# Patient Record
Sex: Male | Born: 1980 | Race: Black or African American | Hispanic: No | Marital: Single | Smoking: Current every day smoker
Health system: Southern US, Community
[De-identification: ages and names within clinical notes are randomized; demographics above are authoritative.]

---

## 1999-11-11 ENCOUNTER — Emergency Department (HOSPITAL_COMMUNITY): Admission: EM | Admit: 1999-11-11 | Discharge: 1999-11-11 | Payer: Self-pay | Admitting: Emergency Medicine

## 1999-11-11 ENCOUNTER — Encounter: Payer: Self-pay | Admitting: Emergency Medicine

## 2016-10-14 ENCOUNTER — Emergency Department (HOSPITAL_COMMUNITY)
Admission: EM | Admit: 2016-10-14 | Discharge: 2016-10-15 | Disposition: A | Discharge status: Home / Self Care | Payer: Self-pay | Attending: Emergency Medicine | Admitting: Emergency Medicine

## 2016-10-14 DIAGNOSIS — S71132A Puncture wound without foreign body, left thigh, initial encounter: Secondary | ICD-10-CM

## 2016-10-14 DIAGNOSIS — Y929 Unspecified place or not applicable: Secondary | ICD-10-CM | POA: Insufficient documentation

## 2016-10-14 DIAGNOSIS — R7989 Other specified abnormal findings of blood chemistry: Secondary | ICD-10-CM

## 2016-10-14 DIAGNOSIS — Z23 Encounter for immunization: Secondary | ICD-10-CM | POA: Insufficient documentation

## 2016-10-14 DIAGNOSIS — Y999 Unspecified external cause status: Secondary | ICD-10-CM | POA: Insufficient documentation

## 2016-10-14 DIAGNOSIS — R791 Abnormal coagulation profile: Secondary | ICD-10-CM | POA: Insufficient documentation

## 2016-10-14 DIAGNOSIS — S71102A Unspecified open wound, left thigh, initial encounter: Secondary | ICD-10-CM | POA: Insufficient documentation

## 2016-10-14 DIAGNOSIS — W3400XA Accidental discharge from unspecified firearms or gun, initial encounter: Secondary | ICD-10-CM | POA: Insufficient documentation

## 2016-10-14 DIAGNOSIS — R740 Nonspecific elevation of levels of transaminase and lactic acid dehydrogenase [LDH]: Secondary | ICD-10-CM | POA: Insufficient documentation

## 2016-10-14 DIAGNOSIS — Y9301 Activity, walking, marching and hiking: Secondary | ICD-10-CM | POA: Insufficient documentation

## 2016-10-14 MED ORDER — TETANUS-DIPHTH-ACELL PERTUSSIS 5-2.5-18.5 LF-MCG/0.5 IM SUSP
0.5000 mL | Freq: Once | INTRAMUSCULAR | Status: AC
  Administered 2016-10-15: 0.5 mL via INTRAMUSCULAR
  Filled 2016-10-14: qty 0.5
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
## 2016-10-15 ENCOUNTER — Emergency Department (HOSPITAL_COMMUNITY): Payer: Self-pay

## 2016-10-15 ENCOUNTER — Other Ambulatory Visit (HOSPITAL_COMMUNITY): Payer: Self-pay

## 2016-10-15 ENCOUNTER — Encounter (HOSPITAL_COMMUNITY): Payer: Self-pay | Admitting: Emergency Medicine

## 2016-10-15 LAB — CBC
HCT: 40.7 % (ref 39.0–52.0)
HEMOGLOBIN: 13.9 g/dL (ref 13.0–17.0)
MCH: 32 pg (ref 26.0–34.0)
MCHC: 34.2 g/dL (ref 30.0–36.0)
MCV: 93.8 fL (ref 78.0–100.0)
PLATELETS: 171 10*3/uL (ref 150–400)
RBC: 4.34 MIL/uL (ref 4.22–5.81)
RDW: 13.9 % (ref 11.5–15.5)
WBC: 8.1 10*3/uL (ref 4.0–10.5)

## 2016-10-15 LAB — I-STAT CHEM 8, ED
BUN: 16 mg/dL (ref 6–20)
CALCIUM ION: 1.03 mmol/L — AB (ref 1.15–1.40)
CREATININE: 1.2 mg/dL (ref 0.61–1.24)
Chloride: 107 mmol/L (ref 101–111)
GLUCOSE: 136 mg/dL — AB (ref 65–99)
HCT: 44 % (ref 39.0–52.0)
HEMOGLOBIN: 15 g/dL (ref 13.0–17.0)
POTASSIUM: 3.5 mmol/L (ref 3.5–5.1)
Sodium: 140 mmol/L (ref 135–145)
TCO2: 20 mmol/L (ref 0–100)

## 2016-10-15 LAB — PROTIME-INR
INR: 0.95
PROTHROMBIN TIME: 12.6 s (ref 11.4–15.2)

## 2016-10-15 LAB — TYPE AND SCREEN
ABO/RH(D): O POS
Antibody Screen: NEGATIVE
UNIT DIVISION: 0
UNIT DIVISION: 0

## 2016-10-15 LAB — COMPREHENSIVE METABOLIC PANEL
ALK PHOS: 40 U/L (ref 38–126)
ALT: 19 U/L (ref 17–63)
ANION GAP: 12 (ref 5–15)
AST: 30 U/L (ref 15–41)
Albumin: 3.9 g/dL (ref 3.5–5.0)
BUN: 13 mg/dL (ref 6–20)
CALCIUM: 9 mg/dL (ref 8.9–10.3)
CHLORIDE: 108 mmol/L (ref 101–111)
CO2: 20 mmol/L — ABNORMAL LOW (ref 22–32)
CREATININE: 1.28 mg/dL — AB (ref 0.61–1.24)
Glucose, Bld: 139 mg/dL — ABNORMAL HIGH (ref 65–99)
Potassium: 3.6 mmol/L (ref 3.5–5.1)
SODIUM: 140 mmol/L (ref 135–145)
Total Bilirubin: 0.4 mg/dL (ref 0.3–1.2)
Total Protein: 6.2 g/dL — ABNORMAL LOW (ref 6.5–8.1)

## 2016-10-15 LAB — CDS SEROLOGY

## 2016-10-15 LAB — PREPARE FRESH FROZEN PLASMA
UNIT DIVISION: 0
Unit division: 0

## 2016-10-15 LAB — I-STAT CG4 LACTIC ACID, ED: Lactic Acid, Venous: 5.73 mmol/L (ref 0.5–1.9)

## 2016-10-15 LAB — ABO/RH: ABO/RH(D): O POS

## 2016-10-15 LAB — ETHANOL

## 2016-10-15 MED ORDER — KETOROLAC TROMETHAMINE 30 MG/ML IJ SOLN: 30.0000 mg | Freq: Once | INTRAMUSCULAR | Status: DC

## 2016-10-15 MED ORDER — TRAMADOL HCL 50 MG PO TABS: 50.0000 mg | ORAL_TABLET | Freq: Four times a day (QID) | ORAL | 0 refills | Status: DC | PRN

## 2016-10-15 MED ORDER — KETOROLAC TROMETHAMINE 15 MG/ML IJ SOLN
INTRAMUSCULAR | Status: AC
  Administered 2016-10-15: 30 mg
  Filled 2016-10-15: qty 2
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
## 2016-10-15 NOTE — Discharge Instructions (Signed)
Take ibuprofen or acetaminophen as needed for less severe pain. °
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
## 2018-07-11 ENCOUNTER — Other Ambulatory Visit: Payer: Self-pay

## 2018-07-11 ENCOUNTER — Emergency Department (HOSPITAL_COMMUNITY): Payer: Self-pay

## 2018-07-11 ENCOUNTER — Emergency Department (HOSPITAL_BASED_OUTPATIENT_CLINIC_OR_DEPARTMENT_OTHER): Payer: Self-pay

## 2018-07-11 ENCOUNTER — Emergency Department (HOSPITAL_COMMUNITY)
Admission: EM | Admit: 2018-07-11 | Discharge: 2018-07-11 | Disposition: A | Discharge status: Home / Self Care | Payer: Self-pay | Attending: Emergency Medicine | Admitting: Emergency Medicine

## 2018-07-11 ENCOUNTER — Encounter (HOSPITAL_COMMUNITY): Payer: Self-pay | Admitting: *Deleted

## 2018-07-11 DIAGNOSIS — R600 Localized edema: Secondary | ICD-10-CM | POA: Insufficient documentation

## 2018-07-11 DIAGNOSIS — R609 Edema, unspecified: Secondary | ICD-10-CM

## 2018-07-11 DIAGNOSIS — F17210 Nicotine dependence, cigarettes, uncomplicated: Secondary | ICD-10-CM | POA: Insufficient documentation

## 2018-07-11 LAB — COMPREHENSIVE METABOLIC PANEL
ALT: 24 U/L (ref 0–44)
AST: 19 U/L (ref 15–41)
Albumin: 3.9 g/dL (ref 3.5–5.0)
Alkaline Phosphatase: 55 U/L (ref 38–126)
Anion gap: 9 (ref 5–15)
BUN: 17 mg/dL (ref 6–20)
CO2: 23 mmol/L (ref 22–32)
Calcium: 8.9 mg/dL (ref 8.9–10.3)
Chloride: 106 mmol/L (ref 98–111)
Creatinine, Ser: 1.15 mg/dL (ref 0.61–1.24)
GFR calc Af Amer: >60 mL/min (ref >60)
GFR calc non Af Amer: >60 mL/min (ref >60)
Glucose, Bld: 94 mg/dL (ref 70–99)
Potassium: 4.1 mmol/L (ref 3.5–5.1)
Sodium: 138 mmol/L (ref 135–145)
Total Bilirubin: 0.5 mg/dL (ref 0.3–1.2)
Total Protein: 6.4 g/dL — ABNORMAL LOW (ref 6.5–8.1)

## 2018-07-11 LAB — CBC
HCT: 41.9 % (ref 39.0–52.0)
Hemoglobin: 13.7 g/dL (ref 13.0–17.0)
MCH: 32 pg (ref 26.0–34.0)
MCHC: 32.7 g/dL (ref 30.0–36.0)
MCV: 97.9 fL (ref 78.0–100.0)
PLATELETS: 171 10*3/uL (ref 150–400)
RBC: 4.28 MIL/uL (ref 4.22–5.81)
RDW: 13.7 % (ref 11.5–15.5)
WBC: 7.5 10*3/uL (ref 4.0–10.5)

## 2018-07-11 LAB — PROTIME-INR
INR: 0.96
PROTHROMBIN TIME: 12.7 s (ref 11.4–15.2)

## 2018-07-11 MED ORDER — FUROSEMIDE 20 MG PO TABS: 20.0000 mg | ORAL_TABLET | Freq: Every day | ORAL | 1 refills | Status: AC

## 2018-07-11 MED ORDER — IOHEXOL 300 MG/ML  SOLN
100.0000 mL | Freq: Once | INTRAMUSCULAR | Status: AC | PRN
  Administered 2018-07-11: 100 mL via INTRAVENOUS

## 2018-07-11 NOTE — ED Triage Notes (Signed)
The pt has a pedal pulse lt foot

## 2018-07-11 NOTE — ED Notes (Signed)
Pt verbalizes understanding of d/c instructions. Pt received prescriptions. Pt ambulatory at d/c with all belongings.  
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
## 2018-07-11 NOTE — ED Notes (Signed)
Patient transported to CT 

## 2018-07-11 NOTE — Progress Notes (Signed)
VASCULAR LAB PRELIMINARY  PRELIMINARY  PRELIMINARY  PRELIMINARY  Left lower extremity venous duplex completed.    Preliminary report:  There is no DVT or SVT noted in the left lower extremity.  There is an enlarged inguinal lymph node that appears vascularized.  Interstitial fluid noted throughout calf.  [facility] to [facility], PA-C  Arsal Tappan, [facility], RVT 07/11/2018, 6:40 PM

## 2018-07-11 NOTE — ED Notes (Signed)
Family at bedside. 

## 2018-11-05 IMAGING — CT CT VENOGRAM ABD-PELV
2 of 4 series · 16 of 46 positions shown, 18 images · IV contrast (omnipaque)
Comparison: None.

CLINICAL DATA: Severe left lower extremity swelling. Suspected
central venous compromise or mass.

EXAM:
CT ABDOMEN AND PELVIS WITH CONTRAST
TECHNIQUE: Multidetector CT imaging of the abdomen and pelvis was performed
using the venogram protocol, following administration of intravenous
contrast.
CONTRAST:  100mL OMNIPAQUE IOHEXOL 300 MG/ML  SOLN

[Series 3: abd/ pelvis 5.0 i30f 2 · axial · 0.98mm/px · z∈[+892,+1352]mm · 13 of 102 slices shown, 15 images]
[im 5/102  soft-tissue]
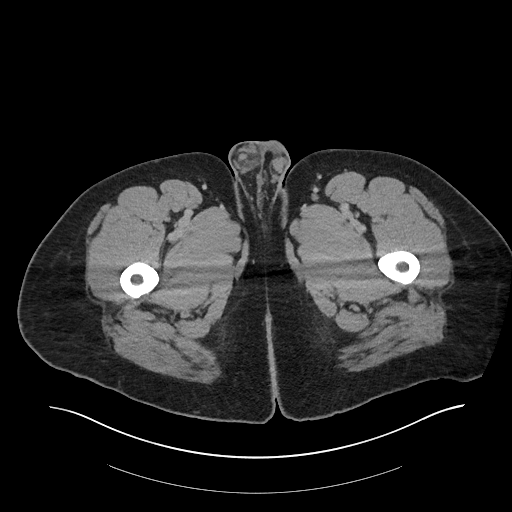
[im 5/102  bone]
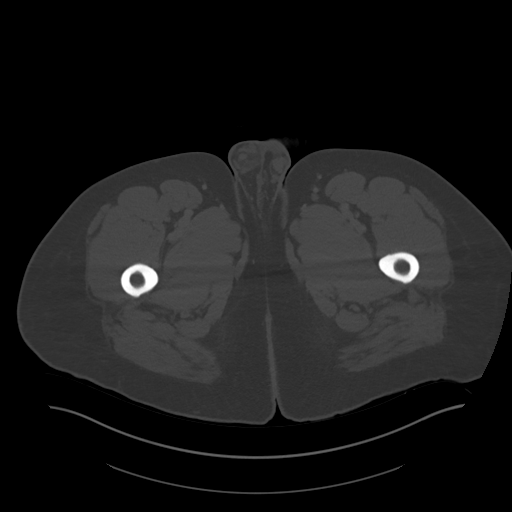
[im 13/102  soft-tissue]
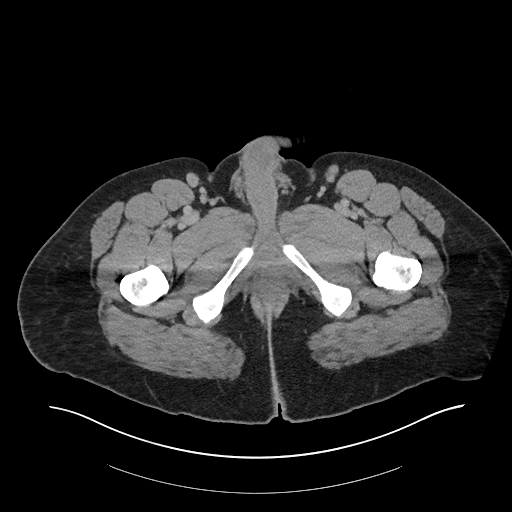
[im 22/102  soft-tissue]
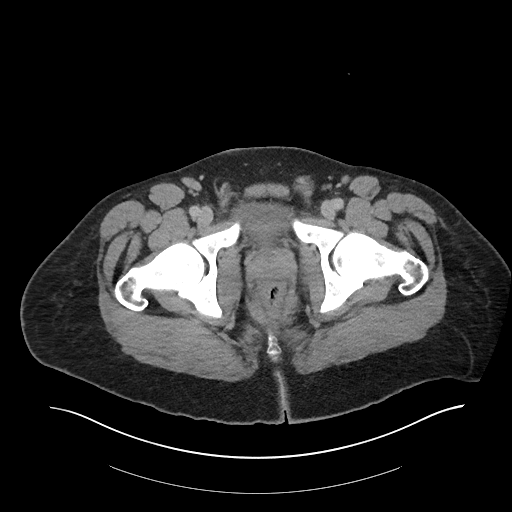
[im 30/102  soft-tissue]
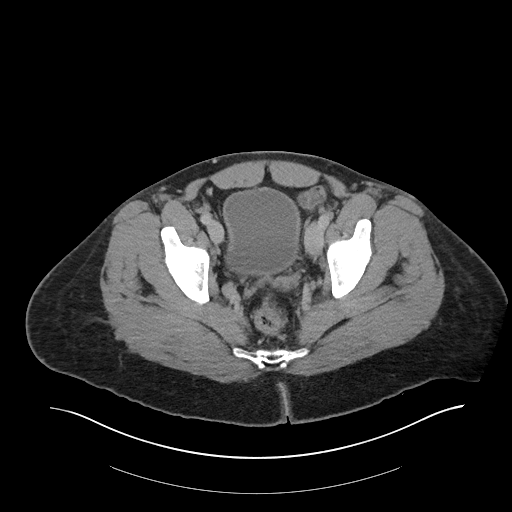
[im 34/102  soft-tissue]
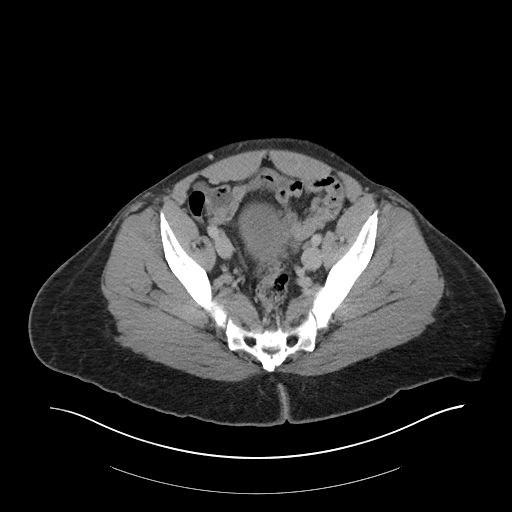
[im 43/102  soft-tissue]
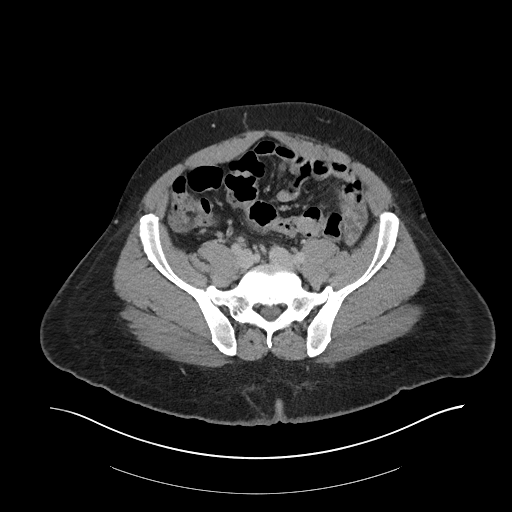
[im 51/102  soft-tissue]
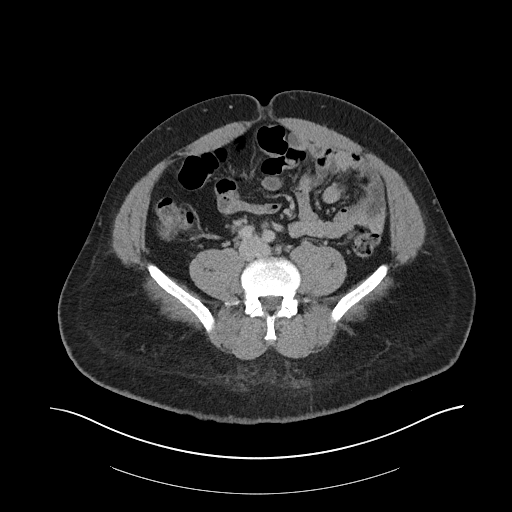
[im 59/102  soft-tissue]
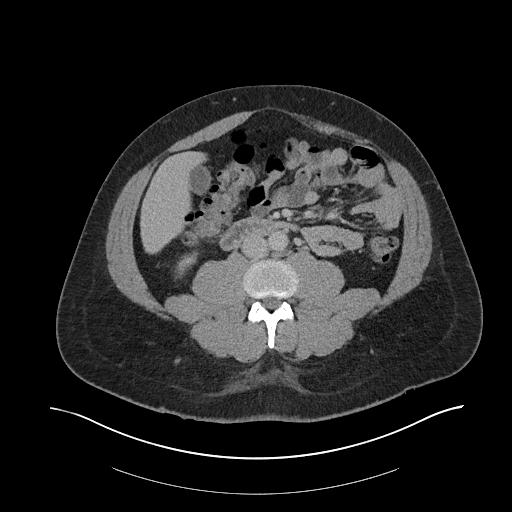
[im 68/102  soft-tissue]
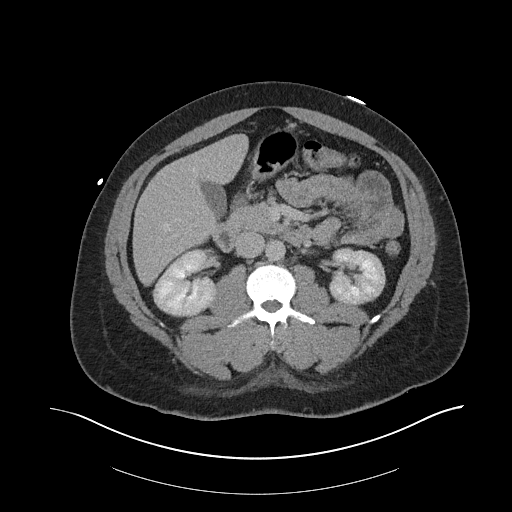
[im 68/102  bone]
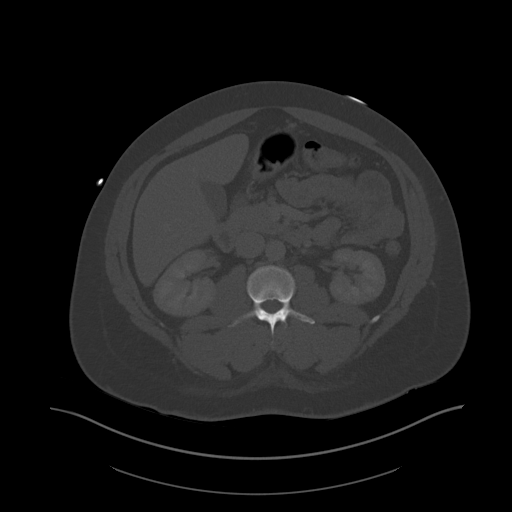
[im 72/102  soft-tissue]
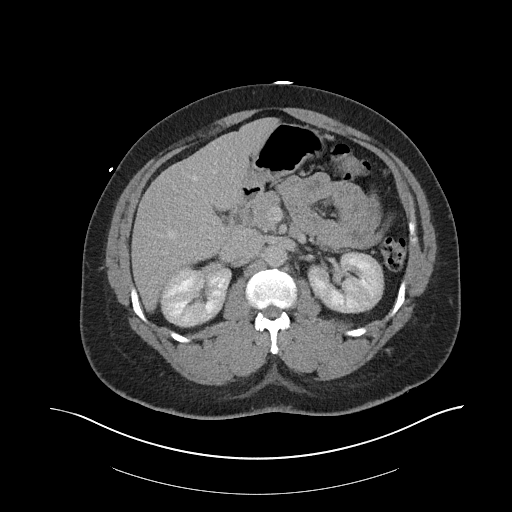
[im 80/102  soft-tissue]
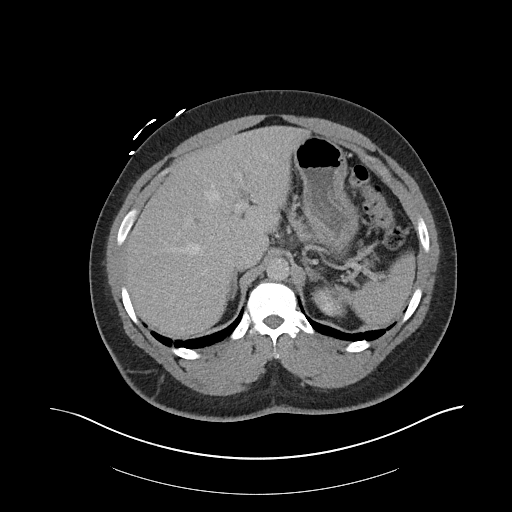
[im 89/102  soft-tissue]
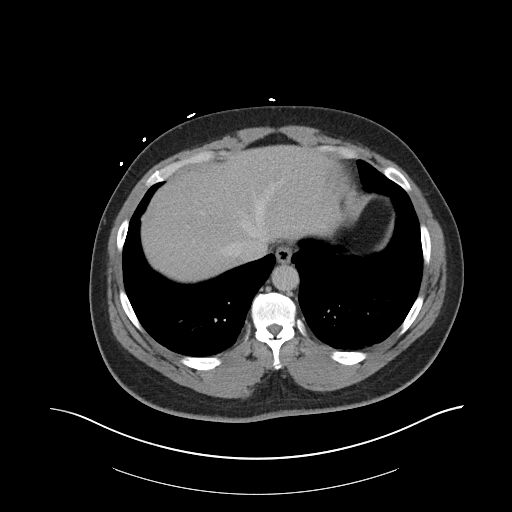
[im 97/102  soft-tissue]
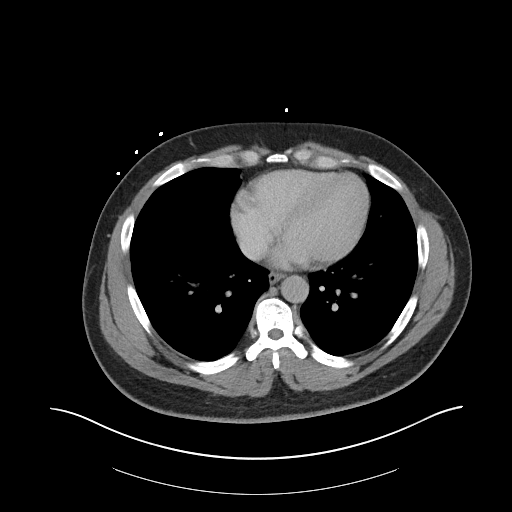

[Series 6: coronal soft tissue · coronal · 0.83mm/px · 3 of 101 slices shown]
[im 34/101  soft-tissue]
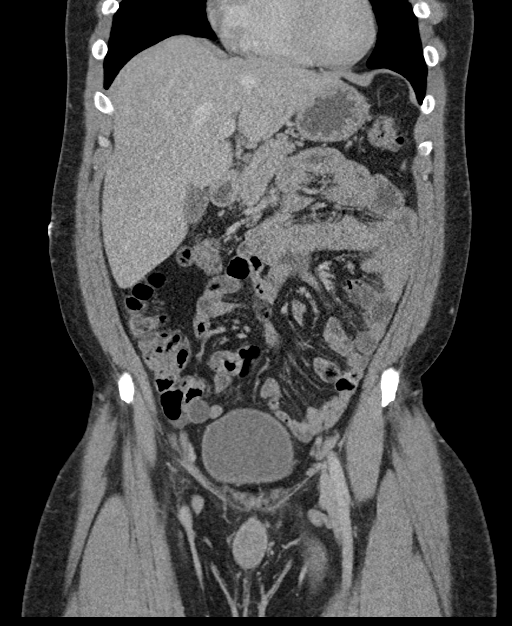
[im 45/101  soft-tissue]
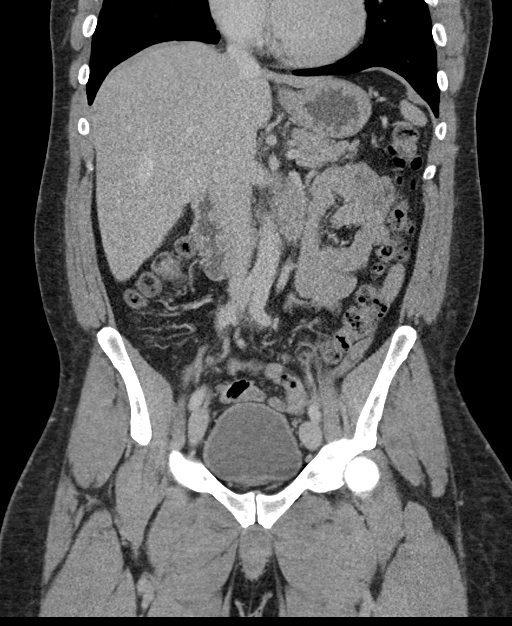
[im 56/101  soft-tissue]
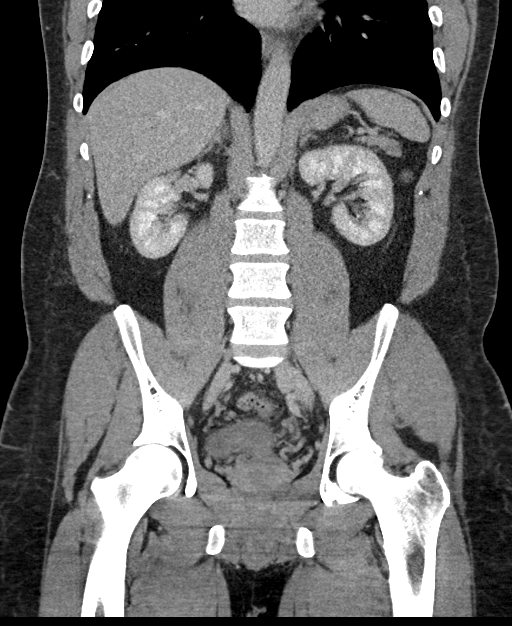

[16 of 46 positions shown; findings below may reference images not displayed]

FINDINGS: Lower Chest: No acute findings.

Hepatobiliary: No hepatic masses identified. Gallbladder is
unremarkable.

Pancreas:  No mass or inflammatory changes.

Spleen: Within normal limits in size and appearance.

Adrenals/Urinary Tract: No masses identified. No evidence of
hydronephrosis.

Stomach/Bowel: No evidence of obstruction, inflammatory process or
abnormal fluid collections.

Vascular/Lymphatic: No pathologically enlarged lymph nodes or other
soft tissue masses. No evidence of extrinsic compression or
thrombosis of the IVC, iliac, or femoral veins. No abdominal aortic
aneurysm.

Reproductive:  No mass or other significant abnormality.

Other:  None.

Musculoskeletal:  No suspicious bone lesions identified.
IMPRESSION: Negative. No evidence of mass, external venous compression or venous
thrombosis within the abdomen or pelvis.

## 2023-02-24 ENCOUNTER — Emergency Department (HOSPITAL_COMMUNITY): Payer: Self-pay

## 2023-02-24 ENCOUNTER — Encounter (HOSPITAL_COMMUNITY): Payer: Self-pay | Admitting: Emergency Medicine

## 2023-02-24 ENCOUNTER — Emergency Department (HOSPITAL_COMMUNITY)
Admission: EM | Admit: 2023-02-24 | Discharge: 2023-02-24 | Disposition: A | Discharge status: Home / Self Care | Payer: Self-pay | Attending: Emergency Medicine | Admitting: Emergency Medicine

## 2023-02-24 DIAGNOSIS — Y92410 Unspecified street and highway as the place of occurrence of the external cause: Secondary | ICD-10-CM | POA: Insufficient documentation

## 2023-02-24 DIAGNOSIS — S8392XA Sprain of unspecified site of left knee, initial encounter: Secondary | ICD-10-CM | POA: Diagnosis not present

## 2023-02-24 DIAGNOSIS — S8992XA Unspecified injury of left lower leg, initial encounter: Secondary | ICD-10-CM | POA: Diagnosis present

## 2023-02-24 MED ORDER — IBUPROFEN 600 MG PO TABS: 600.0000 mg | ORAL_TABLET | Freq: Three times a day (TID) | ORAL | 0 refills | Status: AC | PRN

## 2023-02-24 NOTE — ED Notes (Signed)
DC instructions reviewed with pt. Pt verbalized understanding.  PT DC.  
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
